# Patient Record
Sex: Male | Born: 2000
Health system: Southern US, Community
[De-identification: ages and names within clinical notes are randomized; demographics above are authoritative.]

---
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
## 2020-05-07 ENCOUNTER — Encounter (HOSPITAL_COMMUNITY): Payer: Self-pay

## 2020-05-07 ENCOUNTER — Other Ambulatory Visit: Payer: Self-pay

## 2020-05-07 ENCOUNTER — Ambulatory Visit (HOSPITAL_COMMUNITY)
Admission: EM | Admit: 2020-05-07 | Discharge: 2020-05-07 | Disposition: A | Discharge status: Home / Self Care | Payer: Medicaid Other

## 2020-05-07 DIAGNOSIS — R002 Palpitations: Secondary | ICD-10-CM

## 2020-05-07 NOTE — ED Triage Notes (Signed)
Pt presents with "feeling of fast heart beat" this morning for about 30 minutes.

## 2020-05-07 NOTE — Discharge Instructions (Signed)
Make sure to stay well hydrated, get plenty of rest and keep stress levels under control.  Follow up with your Primary Care or a Cardiologist if your symptoms do not improve.   [facility] Address: [address], [place], [place] [address] Hours:  Open ? Closes 5PM Phone: [phone]

## 2020-05-07 NOTE — ED Provider Notes (Signed)
[facility]    CSN: [id number] Arrival date & time: 05/07/20  1053      History   Chief Complaint Chief Complaint  Patient presents with  . Fast Heart Rate    HPI Lance Wilson is a 19 y.o. male.   Here today for evaluation of an episode this morning lasting about 30 minutes of intermittent palpitations, SOB. States he was walking around outside when this occurred. Did feel dehydrated at the time and notes that he hasn't slept in over 24 hours. Denies drug or alcohol use, caffeine consumption, dizziness, confusion, syncope, hx of heart issues, fhx of heart issues. Sxs self resolved earlier today.      History reviewed. No pertinent past medical history.  There are no problems to display for this patient.   History reviewed. No pertinent surgical history.     Home Medications    Prior to Admission medications   Medication Sig Start Date End Date Taking? Authorizing Provider  ibuprofen (ADVIL,MOTRIN) 600 MG tablet Take 1 tab PO Q6h x 2 days then Q6h prn 03/18/12   Lowanda Foster, NP    Family History Family History  Family history unknown: Yes    Social History Social History   Tobacco Use  . Smoking status: Never Smoker  Substance Use Topics  . Alcohol use: No  . Drug use: Not on file     Allergies   Patient has no known allergies.   Review of Systems Review of Systems PER HPI    Physical Exam Triage Vital Signs ED Triage Vitals  Enc Vitals Group     BP 05/07/20 1222 136/84     Pulse Rate 05/07/20 1218 73     Resp 05/07/20 1218 18     Temp 05/07/20 1218 98.1 F (36.7 C)     Temp Source 05/07/20 1218 Oral     SpO2 05/07/20 1218 97 %     Weight --      Height --      Head Circumference --      Peak Flow --      Pain Score 05/07/20 1221 0     Pain Loc --      Pain Edu? --      Excl. in GC? --    No data found.  Updated Vital Signs BP 136/84 (BP Location: Left Arm)   Pulse 73   Temp 98.1 F (36.7 C) (Oral)   Resp  18   SpO2 97%   Visual Acuity Right Eye Distance:   Left Eye Distance:   Bilateral Distance:    Right Eye Near:   Left Eye Near:    Bilateral Near:     Physical Exam Vitals and nursing note reviewed.  Constitutional:      Appearance: Normal appearance.  HENT:     Head: Atraumatic.  Eyes:     Extraocular Movements: Extraocular movements intact.     Conjunctiva/sclera: Conjunctivae normal.  Cardiovascular:     Rate and Rhythm: Normal rate and regular rhythm.     Pulses: Normal pulses.     Heart sounds: Normal heart sounds.  Pulmonary:     Effort: Pulmonary effort is normal.     Breath sounds: Normal breath sounds. No wheezing or rales.  Abdominal:     General: Bowel sounds are normal. There is no distension.     Palpations: Abdomen is soft.     Tenderness: There is no abdominal tenderness. There is no right CVA tenderness,  left CVA tenderness or guarding.  Musculoskeletal:        General: Normal range of motion.     Cervical back: Normal range of motion and neck supple.  Skin:    General: Skin is warm and dry.  Neurological:     General: No focal deficit present.     Mental Status: He is oriented to person, place, and time.  Psychiatric:        Mood and Affect: Mood normal.        Thought Content: Thought content normal.        Judgment: Judgment normal.      UC Treatments / Results  Labs (all labs ordered are listed, but only abnormal results are displayed) Labs Reviewed - No data to display  EKG   Radiology No results found.  Procedures Procedures (including critical care time)  Medications Ordered in UC Medications - No data to display  Initial Impression / Assessment and Plan / UC Course  I have reviewed the triage vital signs and the nursing notes.  Pertinent labs & imaging results that were available during my care of the patient were reviewed by me and considered in my medical decision making (see chart for details).     Sxs resolved, exam  and vitals benign, pt declining EKG and labs today because he needs to get to work. Discussed good hydration, good sleep habits, stress control, and gave Cardiology information if sxs recur/worsen.   Final Clinical Impressions(s) / UC Diagnoses   Final diagnoses:  Palpitations     Discharge Instructions     Make sure to stay well hydrated, get plenty of rest and keep stress levels under control.  Follow up with your Primary Care or a Cardiologist if your symptoms do not improve.   [facility] Address: [address], [place], [place] [address] Hours:  Open ? Closes 5PM Phone: [phone]    ED Prescriptions    None     PDMP not reviewed this encounter.   Particia Nearing, [place] 05/07/20 1334

## 2020-11-06 ENCOUNTER — Ambulatory Visit (HOSPITAL_COMMUNITY): Payer: Self-pay

## 2020-11-08 ENCOUNTER — Telehealth (HOSPITAL_COMMUNITY): Payer: Self-pay

## 2020-11-08 ENCOUNTER — Ambulatory Visit (HOSPITAL_COMMUNITY)
Admission: EM | Admit: 2020-11-08 | Discharge: 2020-11-08 | Disposition: A | Discharge status: Home / Self Care | Payer: Medicaid Other | Attending: Emergency Medicine | Admitting: Emergency Medicine

## 2020-11-08 ENCOUNTER — Encounter (HOSPITAL_COMMUNITY): Payer: Self-pay

## 2020-11-08 DIAGNOSIS — Z113 Encounter for screening for infections with a predominantly sexual mode of transmission: Secondary | ICD-10-CM | POA: Diagnosis not present

## 2020-11-08 DIAGNOSIS — R369 Urethral discharge, unspecified: Secondary | ICD-10-CM | POA: Diagnosis present

## 2020-11-08 LAB — HIV ANTIBODY (ROUTINE TESTING W REFLEX): HIV Screen 4th Generation wRfx: NONREACTIVE

## 2020-11-08 MED ORDER — CEFTRIAXONE SODIUM 500 MG IJ SOLR
500.0000 mg | Freq: Once | INTRAMUSCULAR | Status: AC
  Administered 2020-11-08: 500 mg via INTRAMUSCULAR

## 2020-11-08 MED ORDER — LIDOCAINE HCL (PF) 1 % IJ SOLN
INTRAMUSCULAR | Status: AC
  Filled 2020-11-08: qty 2

## 2020-11-08 MED ORDER — CEFTRIAXONE SODIUM 500 MG IJ SOLR
INTRAMUSCULAR | Status: AC
  Filled 2020-11-08: qty 500

## 2020-11-08 MED ORDER — DOXYCYCLINE HYCLATE 100 MG PO CAPS: 100.0000 mg | ORAL_CAPSULE | Freq: Two times a day (BID) | ORAL | 0 refills | Status: AC

## 2020-11-08 MED ORDER — DOXYCYCLINE HYCLATE 100 MG PO CAPS: 100.0000 mg | ORAL_CAPSULE | Freq: Two times a day (BID) | ORAL | 0 refills | Status: DC

## 2020-11-08 NOTE — ED Triage Notes (Signed)
Pt present penile discharge with a burning sensation after urinating. Symptom started 3 days ago.

## 2020-11-08 NOTE — ED Provider Notes (Signed)
HPI  SUBJECTIVE:  Lance Wilson is a 20 y.o. male who presents with 3 to 4 days of dysuria, penile discharge.  Unable to characterize the penile discharge.  He had intercourse with a new male partner recently, did not use condoms.  He does not know if she has any symptoms.  He has no other urinary complaints.  No genital rash, itching, vomiting abdominal, back, pelvic, perineal pain.  No testicular, scrotal pain, swelling.  No aggravating or alleviating factors.  He has not tried anything for symptoms.  Past medical history negative for gonorrhea, chlamydia, HIV, HSV, syphilis, trichomonas, UTI, diabetes.  PMD: None   History reviewed. No pertinent past medical history.  History reviewed. No pertinent surgical history.  Family History  Family history unknown: Yes    Social History   Tobacco Use  . Smoking status: Never Smoker  Substance Use Topics  . Alcohol use: No     Current Facility-Administered Medications:  .  cefTRIAXone (ROCEPHIN) injection 500 mg, 500 mg, Intramuscular, Once, Domenick Gong, MD  Current Outpatient Medications:  .  doxycycline (VIBRAMYCIN) 100 MG capsule, Take 1 capsule (100 mg total) by mouth 2 (two) times daily for 7 days., Disp: 14 capsule, Rfl: 0 .  ibuprofen (ADVIL,MOTRIN) 600 MG tablet, Take 1 tab PO Q6h x 2 days then Q6h prn, Disp: 30 tablet, Rfl: 0  No Known Allergies   ROS  As noted in HPI.   Physical Exam  BP (!) 162/88 (BP Location: Right Arm)   Pulse 71   Temp 98.9 F (37.2 C) (Oral)   Resp 18   SpO2 100%   Constitutional: Well developed, well nourished, no acute distress Eyes:  EOMI, conjunctiva normal bilaterally HENT: Normocephalic, atraumatic,mucus membranes moist Respiratory: Normal inspiratory effort Cardiovascular: Normal rate GI: nondistended GU: Normal uncircumcised male, testes descended bilaterally.  No penile rash, obvious discharge.  No testicular, epididymal, scrotal swelling, tenderness.  No  appreciable inguinal lymphadenopathy.  Patient declined chaperone. skin: No rash, skin intact Musculoskeletal: no deformities Neurologic: Alert & oriented x 3, no focal neuro deficits Psychiatric: Speech and behavior appropriate   ED Course   Medications  cefTRIAXone (ROCEPHIN) injection 500 mg (has no administration in time range)    Orders Placed This Encounter  Procedures  . HIV Antibody (routine testing w rflx)    Standing Status:   Standing    Number of Occurrences:   1  . RPR    Standing Status:   Standing    Number of Occurrences:   1    No results found for this or any previous visit (from the past 24 hour(s)). No results found.  ED Clinical Impression  1. Penile discharge   2. Screening examination for STD (sexually transmitted disease)      ED Assessment/Plan  will treat empirically for gonorrhea and chlamydia.  Swab for gonorrhea, chlamydia, trichomonas sent.  Will give 500 mg of Rocephin here, send home with doxycycline for a week.  Advised patient to refrain from intercourse for 2 weeks.  Will also check HIV and RPR.  Will provide primary care list for ongoing care, order assistance in finding PMD.  Discussed labs, MDM, treatment plan, and plan for follow-up with patient. patient agrees with plan.   Meds ordered this encounter  Medications  . cefTRIAXone (ROCEPHIN) injection 500 mg  . doxycycline (VIBRAMYCIN) 100 MG capsule    Sig: Take 1 capsule (100 mg total) by mouth 2 (two) times daily for 7 days.  Dispense:  14 capsule    Refill:  0    *This clinic note was created using Scientist, clinical (histocompatibility and immunogenetics). Therefore, there may be occasional mistakes despite careful proofreading.   ?    Domenick Gong, MD 11/08/20 1225

## 2020-11-08 NOTE — Discharge Instructions (Addendum)
Take the medication as written.  Finish the doxycycline, even if you feel better.  Give [place] a working phone number so that we can contact you if needed. Refrain from sexual contact for 2 weeks in order to give the medicine time to work.    Below is a list of primary care practices who are taking new patients for you to follow-up with.  [facility] internal medicine clinic Ground Floor - [facility], [address] [place], [place], [place] [address] [phone]  [facility] at [facility] [address] [place], [place] [address] [phone]  [facility] [address] [place], [place] [address] [phone]  [facility] [phone] [address] [place] [address]  [facility]: [address] [place] [address]  [phone]  [facility]: [address] [place] [address]   [phone]  [facility]: [address] [place] [address]  [phone]  [facility] : [address] [place] [address] [phone]  [facility] Primary Care: [address] [place] [address] [phone]  [facility]: [address] [place] [address] [phone]  Dr. Oneal Grout [address] [facility] [place] [address]  [phone]  Dr. Jackie Plum, [facility]. [address] [place], [place] [address]  [phone]  Go to www.goodrx.com to look up your medications. This will give you a list of where you can find your prescriptions at the most affordable prices. Or ask the pharmacist what the cash price is, or if they have any other discount programs available to help make your medication more affordable. This can be less  expensive than what you would pay with insurance.

## 2020-11-09 ENCOUNTER — Ambulatory Visit (HOSPITAL_COMMUNITY): Payer: Self-pay

## 2020-11-09 LAB — CYTOLOGY, (ORAL, ANAL, URETHRAL) ANCILLARY ONLY
Chlamydia: NEGATIVE
Comment: NEGATIVE
Comment: NEGATIVE
Comment: NORMAL
Neisseria Gonorrhea: NEGATIVE
Trichomonas: NEGATIVE

## 2020-11-09 LAB — RPR: RPR Ser Ql: NONREACTIVE

## 2020-12-15 ENCOUNTER — Telehealth: Payer: Medicaid Other | Admitting: Nurse Practitioner

## 2020-12-15 DIAGNOSIS — Z5329 Procedure and treatment not carried out because of patient's decision for other reasons: Secondary | ICD-10-CM

## 2020-12-15 DIAGNOSIS — Z91199 Patient's noncompliance with other medical treatment and regimen due to unspecified reason: Secondary | ICD-10-CM

## 2020-12-15 NOTE — Progress Notes (Signed)
No show

## 2021-04-10 ENCOUNTER — Encounter: Payer: Self-pay | Admitting: Family

## 2021-04-10 ENCOUNTER — Telehealth: Payer: Medicaid Other | Admitting: Family

## 2021-04-10 DIAGNOSIS — L0291 Cutaneous abscess, unspecified: Secondary | ICD-10-CM | POA: Diagnosis not present

## 2021-04-10 MED ORDER — SULFAMETHOXAZOLE-TRIMETHOPRIM 800-160 MG PO TABS: 1.0000 | ORAL_TABLET | Freq: Two times a day (BID) | ORAL | 0 refills | Status: DC

## 2021-04-10 NOTE — Progress Notes (Signed)
Virtual Visit Consent   Lance Wilson, you are scheduled for a virtual visit with a [facility] provider today.     Just as with appointments in the office, your consent must be obtained to participate.  Your consent will be active for this visit and any virtual visit you may have with one of our providers in the next 365 days.     If you have a MyChart account, a copy of this consent can be sent to you electronically.  All virtual visits are billed to your insurance company just like a traditional visit in the office.    As this is a virtual visit, video technology does not allow for your provider to perform a traditional examination.  This may limit your provider's ability to fully assess your condition.  If your provider identifies any concerns that need to be evaluated in person or the need to arrange testing (such as labs, EKG, etc.), we will make arrangements to do so.     Although advances in technology are sophisticated, we cannot ensure that it will always work on either your end or our end.  If the connection with a video visit is poor, the visit may have to be switched to a telephone visit.  With either a video or telephone visit, we are not always able to ensure that we have a secure connection.     I need to obtain your verbal consent now.   Are you willing to proceed with your visit today?    [name] has provided verbal consent on 04/10/2021 for a virtual visit (video or telephone).   [name], FNP   Date: 04/10/2021 4:01 PM   Virtual Visit via Video Note   I, [name], connected with  Lance Wilson  ([id number], 22-Feb-2001) on 04/10/21 at  4:00 PM EDT by a video-enabled telemedicine application and verified that I am speaking with the correct person using two identifiers.  Location: Patient: Virtual Visit Location Patient: Home Provider: Virtual Visit Location Provider: Home   I discussed the limitations of evaluation and management by  telemedicine and the availability of in person appointments. The patient expressed understanding and agreed to proceed.    History of Present Illness: Lance Wilson is a 20 y.o. who identifies as a male who was assigned male at birth, and is being seen today for abscess he noticed two days ago. He reports yellow thick, bloody discharge. Denies any fever. He reports his pain was a 5 out 10 , but improved since draining. The area is still draining a bloody discharge and is larger than a fifty cent piece.   HPI: HPI  Problems: There are no problems to display for this patient.   Allergies: No Known Allergies Medications:  Current Outpatient Medications:    sulfamethoxazole-trimethoprim (BACTRIM DS) 800-160 MG tablet, Take 1 tablet by mouth 2 (two) times daily., Disp: 14 tablet, Rfl: 0   ibuprofen (ADVIL,MOTRIN) 600 MG tablet, Take 1 tab PO Q6h x 2 days then Q6h prn, Disp: 30 tablet, Rfl: 0  Observations/Objective: Patient is well-developed, well-nourished in no acute distress.  Resting comfortably  at home.  Head is normocephalic, atraumatic.  No labored breathing. Speech is clear and coherent with logical content.  Patient is alert and oriented at baseline.  Pustule erythemas in groin,  Assessment and Plan: 1. Abscess - sulfamethoxazole-trimethoprim (BACTRIM DS) 800-160 MG tablet; Take 1 tablet by mouth 2 (two) times daily.  Dispense: 14 tablet; Refill: 0  Warm compresses  Avoid picking or squeezing Report any fever or worsening of symptoms   Follow Up Instructions: I discussed the assessment and treatment plan with the patient. The patient was provided an opportunity to ask questions and all were answered. The patient agreed with the plan and demonstrated an understanding of the instructions.  A copy of instructions were sent to the patient via MyChart.  The patient was advised to call back or seek an in-person evaluation if the symptoms worsen or if the condition fails to  improve as anticipated.  Time:  I spent 11 minutes with the patient via telehealth technology discussing the above problems/concerns.    [name], FNP

## 2022-07-30 ENCOUNTER — Telehealth: Payer: Medicaid Other | Admitting: Nurse Practitioner

## 2022-07-30 DIAGNOSIS — B9689 Other specified bacterial agents as the cause of diseases classified elsewhere: Secondary | ICD-10-CM | POA: Diagnosis not present

## 2022-07-30 DIAGNOSIS — J028 Acute pharyngitis due to other specified organisms: Secondary | ICD-10-CM | POA: Diagnosis not present

## 2022-07-30 MED ORDER — AMOXICILLIN 250 MG/5ML PO SUSR: 500.0000 mg | Freq: Three times a day (TID) | ORAL | 0 refills | Status: AC

## 2022-07-30 MED ORDER — IBUPROFEN 100 MG/5ML PO SUSP: 600.0000 mg | Freq: Four times a day (QID) | ORAL | 0 refills | Status: DC

## 2022-07-30 NOTE — Progress Notes (Signed)
Virtual Visit Consent   Lance Wilson, you are scheduled for a virtual visit with a [facility] provider today. Just as with appointments in the office, your consent must be obtained to participate. Your consent will be active for this visit and any virtual visit you may have with one of our providers in the next 365 days. If you have a MyChart account, a copy of this consent can be sent to you electronically.  As this is a virtual visit, video technology does not allow for your provider to perform a traditional examination. This may limit your provider's ability to fully assess your condition. If your provider identifies any concerns that need to be evaluated in person or the need to arrange testing (such as labs, EKG, etc.), we will make arrangements to do so. Although advances in technology are sophisticated, we cannot ensure that it will always work on either your end or our end. If the connection with a video visit is poor, the visit may have to be switched to a telephone visit. With either a video or telephone visit, we are not always able to ensure that we have a secure connection.  By engaging in this virtual visit, you consent to the provision of healthcare and authorize for your insurance to be billed (if applicable) for the services provided during this visit. Depending on your insurance coverage, you may receive a charge related to this service.  I need to obtain your verbal consent now. Are you willing to proceed with your visit today? [name] has provided verbal consent on 07/30/2022 for a virtual visit (video or telephone). Claiborne Rigg, NP  Date: 07/30/2022 4:44 PM  Virtual Visit via Video Note   I, Claiborne Rigg, connected with  HUE FRICK  ([id number], 05-15-2001) on 07/30/22 at  4:30 PM EST by a video-enabled telemedicine application and verified that I am speaking with the correct person using two identifiers.  Location: Patient: Virtual Visit  Location Patient: Mobile Provider: Virtual Visit Location Provider: Home Office   I discussed the limitations of evaluation and management by telemedicine and the availability of in person appointments. The patient expressed understanding and agreed to proceed.    History of Present Illness: Lance Wilson is a 21 y.o. who identifies as a male who was assigned male at birth, and is being seen today for bacterial pharyngitis.    Patient complains of sore throat. Associated symptoms include chills, enlarged tonsils, headache, pain while swallowing, sore throat, swollen glands, and white spots in throat.Onset of symptoms was several days ago, gradually worsening since that time. He is not drinking much. He has not had recent close exposure to someone with proven streptococcal pharyngitis. Sore throat, fever, chills, Onset of symptoms Thursday. Hurts to swallow.    Problems: There are no problems to display for this patient.   Allergies: No Known Allergies Medications:  Current Outpatient Medications:    amoxicillin (AMOXIL) 250 MG/5ML suspension, Take 10 mLs (500 mg total) by mouth 3 (three) times daily for 10 days., Disp: 300 mL, Rfl: 0   ibuprofen (ADVIL) 100 MG/5ML suspension, Take 30 mLs (600 mg total) by mouth every 6 (six) hours., Disp: 480 mL, Rfl: 0   sulfamethoxazole-trimethoprim (BACTRIM DS) 800-160 MG tablet, Take 1 tablet by mouth 2 (two) times daily., Disp: 14 tablet, Rfl: 0  Observations/Objective: Patient is well-developed, well-nourished in no acute distress.  Resting comfortably at home.  Head is normocephalic, atraumatic.  No labored breathing.  Speech is clear but slightly garbled due to pain in throat. Logical content.  Patient is alert and oriented at baseline.  3+ tonsils with tonsil stones  Assessment and Plan: 1. Bacterial pharyngitis - ibuprofen (ADVIL) 100 MG/5ML suspension; Take 30 mLs (600 mg total) by mouth every 6 (six) hours.  Dispense: 480 mL;  Refill: 0 Instructed to be seen in the ED if symptoms worsen, unable to swallow secretions or difficulty breathing  Follow Up Instructions: I discussed the assessment and treatment plan with the patient. The patient was provided an opportunity to ask questions and all were answered. The patient agreed with the plan and demonstrated an understanding of the instructions.  A copy of instructions were sent to the patient via MyChart unless otherwise noted below.    The patient was advised to call back or seek an in-person evaluation if the symptoms worsen or if the condition fails to improve as anticipated.  Time:  I spent 12 minutes with the patient via telehealth technology discussing the above problems/concerns.    Claiborne Rigg, NP

## 2022-07-30 NOTE — Patient Instructions (Signed)
  Helen Hashimoto, thank you for joining Claiborne Rigg, NP for today's virtual visit.  While this provider is not your primary care provider (PCP), if your PCP is located in our provider database this encounter information will be shared with them immediately following your visit.   A [facility] MyChart account gives you access to today's visit and all your visits, tests, and labs performed at [facility] " click here if you don't have a [facility] MyChart account or go to [email or web address]  Consent: (Patient) Lance Wilson provided verbal consent for this virtual visit at the beginning of the encounter.  Current Medications:  Current Outpatient Medications:    amoxicillin (AMOXIL) 250 MG/5ML suspension, Take 10 mLs (500 mg total) by mouth 3 (three) times daily for 10 days., Disp: 300 mL, Rfl: 0   ibuprofen (ADVIL) 100 MG/5ML suspension, Take 30 mLs (600 mg total) by mouth every 6 (six) hours., Disp: 480 mL, Rfl: 0   sulfamethoxazole-trimethoprim (BACTRIM DS) 800-160 MG tablet, Take 1 tablet by mouth 2 (two) times daily., Disp: 14 tablet, Rfl: 0   Medications ordered in this encounter:  Meds ordered this encounter  Medications   ibuprofen (ADVIL) 100 MG/5ML suspension    Sig: Take 30 mLs (600 mg total) by mouth every 6 (six) hours.    Dispense:  480 mL    Refill:  0    Order Specific Question:   Supervising Provider    Answer:   Merrilee Jansky [[id number]]   amoxicillin (AMOXIL) 250 MG/5ML suspension    Sig: Take 10 mLs (500 mg total) by mouth 3 (three) times daily for 10 days.    Dispense:  300 mL    Refill:  0    Order Specific Question:   Supervising Provider    Answer:   Merrilee Jansky [[id number]]     *If you need refills on other medications prior to your next appointment, please contact your pharmacy*  Follow-Up: Call back or seek an in-person evaluation if the symptoms worsen or if the condition fails to improve as anticipated.  [facility] [phone]  Other Instructions Instructed to be seen in the ED if symptoms worsen, unable to swallow secretions or difficulty breathing   If you have been instructed to have an in-person evaluation today at a local Urgent Care facility, please use the link below. It will take you to a list of all of our available [facility], including address, phone number and hours of operation. Please do not delay care.  [facility]  If you or a family member do not have a primary care provider, use the link below to schedule a visit and establish care. When you choose a [facility] primary care physician or advanced practice provider, you gain a long-term partner in health. Find a Primary Care Provider  Learn more about [facility] in-office and virtual care options:  - Get Care Now

## 2023-05-09 ENCOUNTER — Telehealth: Payer: Medicaid Other | Admitting: Family Medicine

## 2023-05-09 DIAGNOSIS — N50812 Left testicular pain: Secondary | ICD-10-CM

## 2023-05-09 NOTE — Progress Notes (Signed)
Because you need in person evaluation of your symptoms, your condition warrants further evaluation and I recommend that you be seen in a face to face visit.   NOTE: There will be NO CHARGE for this eVisit   If you are having a true medical emergency please call 911.

## 2023-05-10 ENCOUNTER — Ambulatory Visit (HOSPITAL_COMMUNITY)
Admission: RE | Admit: 2023-05-10 | Discharge: 2023-05-10 | Disposition: A | Discharge status: Home / Self Care | Payer: Medicaid Other | Source: Ambulatory Visit | Attending: Sports Medicine | Admitting: Sports Medicine

## 2023-05-10 ENCOUNTER — Encounter (HOSPITAL_COMMUNITY): Payer: Self-pay

## 2023-05-10 VITALS — BP 158/97 | HR 82 | Temp 98.7°F | Resp 16

## 2023-05-10 DIAGNOSIS — M545 Low back pain, unspecified: Secondary | ICD-10-CM | POA: Diagnosis present

## 2023-05-10 DIAGNOSIS — N50811 Right testicular pain: Secondary | ICD-10-CM

## 2023-05-10 LAB — POCT URINALYSIS DIP (MANUAL ENTRY)
Bilirubin, UA: NEGATIVE
Blood, UA: NEGATIVE
Glucose, UA: NEGATIVE mg/dL
Ketones, POC UA: NEGATIVE mg/dL
Leukocytes, UA: NEGATIVE
Nitrite, UA: NEGATIVE
Protein Ur, POC: 100 mg/dL — AB
Spec Grav, UA: >=1.03 — AB (ref 1.010–1.025)
Urobilinogen, UA: 0.2 U/dL
pH, UA: 6 (ref 5.0–8.0)

## 2023-05-10 NOTE — ED Provider Notes (Signed)
[facility]    CSN: [id number] Arrival date & time: 05/10/23  0931      History   Chief Complaint Chief Complaint  Patient presents with   Back Pain    Pain in my right testicular. Not serve though - Entered by patient    HPI Lance Wilson is a 22 y.o. male.   He notes right-sided low back pain ongoing for the past 4 days as well as some right sided testicular pain.  He denies any known injury to his low back.  He cannot reproduce the pain with palpation though states that when he is side bending or flexing his abdomen it reproduces the pain.  He denies any radiating pain or numbness down his right leg.  He denies incontinence or lower extremity weakness.  He denies any pain or burning with urination or urethral discharge.  No hematuria.  No abdominal pain, fevers, or N/V. He is sexually active with 1 partner and does not have much concern from an STI standpoint.  Does have a history of STIs.  The history is provided by the patient.  Back Pain   History reviewed. No pertinent past medical history.  There are no problems to display for this patient.   History reviewed. No pertinent surgical history.     Home Medications    Prior to Admission medications   Medication Sig Start Date End Date Taking? Authorizing Provider  ibuprofen (ADVIL) 100 MG/5ML suspension Take 30 mLs (600 mg total) by mouth every 6 (six) hours. 07/30/22   Claiborne Rigg, NP  sulfamethoxazole-trimethoprim (BACTRIM DS) 800-160 MG tablet Take 1 tablet by mouth 2 (two) times daily. 04/10/21   Junie Spencer, FNP    Family History Family History  Family history unknown: Yes    Social History Social History   Tobacco Use   Smoking status: Never  Substance Use Topics   Alcohol use: No     Allergies   Patient has no known allergies.   Review of Systems Review of Systems  Musculoskeletal:  Positive for back pain.   ROS otherwise negative except as documented in  HPI  Physical Exam  No data found.  Updated Vital Signs BP (!) 158/97 (BP Location: Left Arm)   Pulse 82   Temp 98.7 F (37.1 C) (Oral)   Resp 16   SpO2 98%   Physical Exam Constitutional:      General: He is not in acute distress.    Appearance: Normal appearance. He is obese.  HENT:     Head: Normocephalic and atraumatic.  Cardiovascular:     Rate and Rhythm: Normal rate and regular rhythm.  Pulmonary:     Effort: Pulmonary effort is normal. No respiratory distress.  Abdominal:     Palpations: Abdomen is soft.     Tenderness: There is no abdominal tenderness. There is no right CVA tenderness, left CVA tenderness or guarding.  Genitourinary:    Penis: Normal.      Testes: Normal.     Comments: Bilateral testes palpable and nonreproducible tenderness.  No swelling appreciated in the scrotum.  Normal cremasteric reflex. Musculoskeletal:     Comments: Low back without visible deformity or scoliosis. No reproducible tenderness palpation along the midline of his spine or paraspinal muscles.  No SI joint tenderness to palpation.  No gluteus tenderness to palpation.  No oblique tenderness to palpation.  Negative Stinchfield.  5 out of 5 strength on hip flexion and hip abduction.  Negative  straight leg raise and seated slump test. NVI distally.  Neurological:     Mental Status: He is alert.      UC Treatments / Results  Labs (all labs ordered are listed, but only abnormal results are displayed) Labs Reviewed  POCT URINALYSIS DIP (MANUAL ENTRY) - Abnormal; Notable for the following components:      Result Value   Spec Grav, UA >=1.030 (*)    Protein Ur, POC =100 (*)    All other components within normal limits  CYTOLOGY, (ORAL, ANAL, URETHRAL) ANCILLARY ONLY    EKG   Radiology No results found.  Procedures Procedures (including critical care time)  Medications Ordered in UC Medications - No data to display  Initial Impression / Assessment and Plan / UC Course   I have reviewed the triage vital signs and the nursing notes.  Pertinent labs & imaging results that were available during my care of the patient were reviewed by me and considered in my medical decision making (see chart for details).    History and physical exam without any red flags from a low back pain standpoint.  I think this is muscular in nature and provided him home exercise program for lumbar stretching and strengthening.  UA collected without any evidence of infection to suggest [place] though does appear to be slightly dehydrated and he does have a component of proteinuria.  Normal genitourinary exam - low concern for urgent issues such as testicular torsion.  He does have risk factors for STIs so we will check for gonorrhea and chlamydia and communicate these results if they are positive.  On review of his vital signs over the past few years it does appear that he has underlying hypertension and I recommended that he seek evaluation in continuity of care with his primary care physician for long-term management of this.  His questions were answered he is in agreement with this plan.   Final Clinical Impressions(s) / UC Diagnoses   Final diagnoses:  Acute right-sided low back pain without sciatica  Pain in right testicle     Discharge Instructions      Your low back pain is muscular in nature.  I would like for you to apply heat to this as needed and work on a low back stretching and strengthening program (see handout provided during today's visit). For your testicle pain - we have collected STI testing. These results take a day or two to come back. If you don't hear from [place] these were negative.  I recommend you see your primary care doctor in the next month or so about your blood pressure. I recommend you purchase a blood pressure cuff online or at any pharmacy to keep a log of your pressures prior to that visit.     ED Prescriptions   None    PDMP not reviewed this  encounter.   Marisa Cyphers, MD 05/10/23 [phone]

## 2023-05-10 NOTE — Discharge Instructions (Addendum)
Your low back pain is muscular in nature.  I would like for you to apply heat to this as needed and work on a low back stretching and strengthening program (see handout provided during today's visit). For your testicle pain - we have collected STI testing. These results take a day or two to come back. If you don't hear from [place] these were negative.  I recommend you see your primary care doctor in the next month or so about your blood pressure. I recommend you purchase a blood pressure cuff online or at any pharmacy to keep a log of your pressures prior to that visit.

## 2023-05-10 NOTE — ED Triage Notes (Signed)
Pt presents to the office for right side flank and back pain. Pt reports his urine has a odor.

## 2023-05-11 LAB — CYTOLOGY, (ORAL, ANAL, URETHRAL) ANCILLARY ONLY
Chlamydia: NEGATIVE
Comment: NEGATIVE
Comment: NEGATIVE
Comment: NORMAL
Neisseria Gonorrhea: NEGATIVE
Trichomonas: NEGATIVE

## 2024-01-09 ENCOUNTER — Telehealth: Admitting: Physician Assistant

## 2024-01-09 DIAGNOSIS — Z202 Contact with and (suspected) exposure to infections with a predominantly sexual mode of transmission: Secondary | ICD-10-CM | POA: Diagnosis not present

## 2024-01-09 MED ORDER — METRONIDAZOLE 500 MG PO TABS: ORAL_TABLET | ORAL | 0 refills | Status: DC

## 2024-01-09 NOTE — Patient Instructions (Signed)
 Kate Pak, thank you for joining Hyla Maillard, PA-C for today's virtual visit.  While this provider is not your primary care provider (PCP), if your PCP is located in our provider database this encounter information will be shared with them immediately following your visit.   A [facility] MyChart account gives you access to today's visit and all your visits, tests, and labs performed at [facility] " click here if you don't have a [facility] MyChart account or go to [email or web address]  Consent: (Patient) Lance Wilson provided verbal consent for this virtual visit at the beginning of the encounter.  Current Medications:  Current Outpatient Medications:    ibuprofen  (ADVIL ) 100 MG/5ML suspension, Take 30 mLs (600 mg total) by mouth every 6 (six) hours., Disp: 480 mL, Rfl: 0   sulfamethoxazole -trimethoprim  (BACTRIM  DS) 800-160 MG tablet, Take 1 tablet by mouth 2 (two) times daily., Disp: 14 tablet, Rfl: 0   Medications ordered in this encounter:  No orders of the defined types were placed in this encounter.    *If you need refills on other medications prior to your next appointment, please contact your pharmacy*  Follow-Up: Call back or seek an in-person evaluation if the symptoms worsen or if the condition fails to improve as anticipated.  [facility] [phone]  Other Instructions Trichomoniasis Trichomoniasis is a sexually transmitted infection (STI). Many people with trichomoniasis do not have any symptoms (are asymptomatic) or have only minimal symptoms. Untreated trichomoniasis can last from months to years. This condition is treated with medicine. What are the causes? This condition is caused by a parasite called Trichomonas vaginalis and is transmitted during sexual contact. What increases the risk? The following factors may make you more likely to develop this condition: Having unprotected sex. Having sex with a  partner who has trichomoniasis. Having multiple sexual partners. Having had previous trichomoniasis infections or other STIs. What are the signs or symptoms? In females, symptoms of trichomoniasis include: Itching, burning, redness, or soreness in the genital area. Discomfort while urinating. Abnormal vaginal discharge that is clear, white, gray, or yellow-green and foamy and has an unusual fishy odor. In males, symptoms of trichomoniasis include: Discharge from the penis. Burning after urination or ejaculation. Itching or discomfort inside the penis. How is this diagnosed? This condition is diagnosed based on tests. To perform a test, your health care provider will do one of the following: Ask you to provide a urine sample. Take a sample of discharge. The sample may be taken from the vagina or cervix in females and from the urethra in males. Your health care provider may use a swab to collect the sample. Your health care provider may test you for other STIs, including human immunodeficiency virus (HIV). How is this treated?  This condition is treated with medicines such as metronidazole or tinidazole. These are called antimicrobial medicines, and they are taken by mouth (orally). Your sexual partner or partners also need to be tested and treated. If they have the infection and are not treated, you will likely get reinfected. If you plan to become pregnant or think you may be pregnant, tell your health care provider right away. Some medicines that are used to treat the infection should not be taken during pregnancy. Your health care provider may recommend over-the-counter medicines or creams to help relieve itching or irritation. You may be tested for the infection again 3 months after treatment. Follow these instructions at home: Medicines Take over-the-counter and prescription medicines  only as told by your health care provider. Take your antimicrobial medicine as told by your health care  provider. Do not stop taking it even if you start to feel better. Use creams as told by your health care provider. General instructions Do not have sex until after you finish your medicine and your symptoms have resolved. Do not wear tampons while you have the infection (if you are male). Talk with your sexual partner or partners about any symptoms that either of you may have, as well as any history of STIs. Keep all follow-up visits. This is important. How is this prevented?  Use condoms every time you have sex. Using condoms correctly and consistently can help protect against STIs. Do not have sexual contact if you have symptoms of trichomoniasis or another STI. Avoid having multiple sexual partners. Get tested for STIs before you have sex with a partner. Ask all partners to do the same. Do not douche (if you are male). Douching may increase your risk for getting STIs due to the removal of good bacteria in the vagina. Contact a health care provider if: You still have symptoms after you finish your medicine. You develop a rash. You plan to become pregnant or think you may be pregnant. Summary Trichomoniasis is a sexually transmitted infection (STI). This condition often has no symptoms or minimal symptoms. Take your antimicrobial medicine as told by your health care provider. Do not stop taking even if you start to feel better. Discuss your infection with your sexual partner or partners. Make sure that all partners get tested and treated, if necessary. You should not have sex until after you finish your medicine and your symptoms have resolved. Keep all follow-up visits. This is important. This information is not intended to replace advice given to you by your health care provider. Make sure you discuss any questions you have with your health care provider. Document Revised: 07/07/2021 Document Reviewed: 07/07/2021 Elsevier Patient Education  2024 Elsevier Inc.   If you have been  instructed to have an in-person evaluation today at a local Urgent Care facility, please use the link below. It will take you to a list of all of our available [facility], including address, phone number and hours of operation. Please do not delay care.  [facility]  If you or a family member do not have a primary care provider, use the link below to schedule a visit and establish care. When you choose a [facility] primary care physician or advanced practice provider, you gain a long-term partner in health. Find a Primary Care Provider  Learn more about [facility] in-office and virtual care options: [facility] - Get Care Now

## 2024-01-09 NOTE — Progress Notes (Signed)
 Virtual Visit Consent   Lance Wilson, you are scheduled for a virtual visit with a [facility] provider today. Just as with appointments in the office, your consent must be obtained to participate. Your consent will be active for this visit and any virtual visit you may have with one of our providers in the next 365 days. If you have a MyChart account, a copy of this consent can be sent to you electronically.  As this is a virtual visit, video technology does not allow for your provider to perform a traditional examination. This may limit your provider's ability to fully assess your condition. If your provider identifies any concerns that need to be evaluated in person or the need to arrange testing (such as labs, EKG, etc.), we will make arrangements to do so. Although advances in technology are sophisticated, we cannot ensure that it will always work on either your end or our end. If the connection with a video visit is poor, the visit may have to be switched to a telephone visit. With either a video or telephone visit, we are not always able to ensure that we have a secure connection.  By engaging in this virtual visit, you consent to the provision of healthcare and authorize for your insurance to be billed (if applicable) for the services provided during this visit. Depending on your insurance coverage, you may receive a charge related to this service.  I need to obtain your verbal consent now. Are you willing to proceed with your visit today? Lance Wilson has provided verbal consent on 01/09/2024 for a virtual visit (video or telephone). Lance Wilson, [place]  Date: 01/09/2024 4:22 PM   Virtual Visit via Video Note   I, Lance Wilson, connected with  Lance Wilson  ([id number], 03/13/2001) on 01/09/24 at  4:15 PM EDT by a video-enabled telemedicine application and verified that I am speaking with the correct person using two identifiers.  Location: Patient:  Virtual Visit Location Patient: Home Provider: Virtual Visit Location Provider: Home Office   I discussed the limitations of evaluation and management by telemedicine and the availability of in person appointments. The patient expressed understanding and agreed to proceed.    History of Present Illness: Lance Wilson is a 23 y.o. who identifies as a male who was assigned male at birth, and is being seen today for STD exposure.  Endorses GF recently was tested for STI and tested positive for trichomoniasis. She was not having any symptoms, just having a routine STI check with GYN. Notes she was started on treatment and provider recommended her partner be treated as well.  Patient denies any symptoms at present. Was unable to go in for an in-person visit for the next several days due to work so was reaching out to the digital health team.    HPI: HPI  Problems: There are no active problems to display for this patient.   Allergies: No Known Allergies Medications:  Current Outpatient Medications:    metroNIDAZOLE (FLAGYL) 500 MG tablet, Take 4 tablets by mouth as a single dose., Disp: 4 tablet, Rfl: 0  Observations/Objective: Patient is well-developed, well-nourished in no acute distress.  Resting comfortably at home.  Head is normocephalic, atraumatic.  No labored breathing. Speech is clear and coherent with logical content.  Patient is alert and oriented at baseline.   Assessment and Plan: 1. STD exposure (Primary) - metroNIDAZOLE (FLAGYL) 500 MG tablet; Take 4 tablets by mouth as a single dose.  Dispense: 4 tablet; Refill: 0  Trich exposure. Asymptomatic. Unable to get into office for a few days. Will give empiric treatment with Metronidazole. He is to refrain from any activity until treatment complete and he has follow-up full STI testing.   Follow Up Instructions: I discussed the assessment and treatment plan with the patient. The patient was provided an opportunity to ask  questions and all were answered. The patient agreed with the plan and demonstrated an understanding of the instructions.  A copy of instructions were sent to the patient via MyChart unless otherwise noted below.   The patient was advised to call back or seek an in-person evaluation if the symptoms worsen or if the condition fails to improve as anticipated.    Lance Maillard, PA-C

## 2024-05-12 ENCOUNTER — Encounter (HOSPITAL_COMMUNITY): Payer: Self-pay

## 2024-05-12 ENCOUNTER — Ambulatory Visit (HOSPITAL_COMMUNITY)
Admission: EM | Admit: 2024-05-12 | Discharge: 2024-05-12 | Disposition: A | Discharge status: Home / Self Care | Attending: Emergency Medicine | Admitting: Emergency Medicine

## 2024-05-12 DIAGNOSIS — S61211A Laceration without foreign body of left index finger without damage to nail, initial encounter: Secondary | ICD-10-CM

## 2024-05-12 DIAGNOSIS — Z23 Encounter for immunization: Secondary | ICD-10-CM | POA: Diagnosis not present

## 2024-05-12 MED ORDER — MUPIROCIN 2 % EX OINT: 1.0000 | TOPICAL_OINTMENT | Freq: Two times a day (BID) | CUTANEOUS | 0 refills | Status: AC

## 2024-05-12 MED ORDER — TETANUS-DIPHTH-ACELL PERTUSSIS 5-2.5-18.5 LF-MCG/0.5 IM SUSY
PREFILLED_SYRINGE | INTRAMUSCULAR | Status: AC
  Filled 2024-05-12: qty 0.5

## 2024-05-12 MED ORDER — TETANUS-DIPHTH-ACELL PERTUSSIS 5-2.5-18.5 LF-MCG/0.5 IM SUSY
0.5000 mL | PREFILLED_SYRINGE | Freq: Once | INTRAMUSCULAR | Status: AC
  Administered 2024-05-12: 0.5 mL via INTRAMUSCULAR

## 2024-05-12 NOTE — Discharge Instructions (Signed)
 Apply mupirocin  ointment twice daily to the area for infection prevention. Keep the area clean dry and covered for infection prevention. If you notice increased swelling, spreading of redness, or puslike drainage from the area return here for reevaluation.

## 2024-05-12 NOTE — ED Triage Notes (Addendum)
 Patient states he was cutting up yams and cut his left pointer finger today. Patient does not remember when he had a Tetus  vaccine.  Patient denies taking any medications for his pain.

## 2024-05-12 NOTE — ED Provider Notes (Signed)
 [facility]    CSN: [id number] Arrival date & time: 05/12/24  1340      History   Chief Complaint Chief Complaint  Patient presents with   FINGER LACERATION    HPI Lance Wilson is a 23 y.o. male.   Patient presents with laceration to his left index finger that occurred today while cutting yams.  Patient is unsure of when he received his last tetanus booster.  Patient denies any other injuries from this incident.  The history is provided by the patient and medical records.    History reviewed. No pertinent past medical history.  There are no active problems to display for this patient.   History reviewed. No pertinent surgical history.     Home Medications    Prior to Admission medications   Medication Sig Start Date End Date Taking? Authorizing Provider  mupirocin  ointment (BACTROBAN ) 2 % Apply 1 Application topically 2 (two) times daily. 05/12/24  Yes Johnie Rumaldo LABOR, NP    Family History Family History  Problem Relation Age of Onset   Healthy Mother    Healthy Father     Social History Social History   Tobacco Use   Smoking status: Never  Vaping Use   Vaping status: Never Used  Substance Use Topics   Alcohol use: No   Drug use: Never     Allergies   Patient has no known allergies.   Review of Systems Review of Systems  Per HPI  Physical Exam Triage Vital Signs ED Triage Vitals  Encounter Vitals Group     BP 05/12/24 1429 (!) 155/92     Girls Systolic BP Percentile --      Girls Diastolic BP Percentile --      Boys Systolic BP Percentile --      Boys Diastolic BP Percentile --      Pulse Rate 05/12/24 1429 68     Resp 05/12/24 1429 16     Temp 05/12/24 1429 98.6 F (37 C)     Temp Source 05/12/24 1429 Oral     SpO2 05/12/24 1429 98 %     Weight --      Height --      Head Circumference --      Peak Flow --      Pain Score 05/12/24 1428 4     Pain Loc --      Pain Education --      Exclude from Growth  Chart --    No data found.  Updated Vital Signs BP (!) 155/92 (BP Location: Right Arm)   Pulse 68   Temp 98.6 F (37 C) (Oral)   Resp 16   SpO2 98%   Visual Acuity Right Eye Distance:   Left Eye Distance:   Bilateral Distance:    Right Eye Near:   Left Eye Near:    Bilateral Near:     Physical Exam Vitals and nursing note reviewed.  Constitutional:      General: He is awake. He is not in acute distress.    Appearance: Normal appearance. He is well-developed and well-groomed. He is not ill-appearing.  Skin:    General: Skin is warm and dry.     Findings: Laceration present.     Comments: Superficial avulsion laceration noted to lateral left index finger measuring approximately 2 cm x 1 cm.  Bleeding controlled at this time with pressure dressing.  Without nail involvement or presence of foreign body.  Neurological:  Mental Status: He is alert.  Psychiatric:        Behavior: Behavior is cooperative.      UC Treatments / Results  Labs (all labs ordered are listed, but only abnormal results are displayed) Labs Reviewed - No data to display  EKG   Radiology No results found.  Procedures Procedures (including critical care time)  Medications Ordered in UC Medications  Tdap (BOOSTRIX ) injection 0.5 mL (has no administration in time range)    Initial Impression / Assessment and Plan / UC Course  I have reviewed the triage vital signs and the nursing notes.  Pertinent labs & imaging results that were available during my care of the patient were reviewed by me and considered in my medical decision making (see chart for details).     Patient is overall well-appearing.  Vitals are stable.  Unable to repair due to left skin flap with avulsion laceration.  Cleaned wound in clinic and applied bacitracin and bulky dressing.  Updated Tdap in clinic.  Prescribed mupirocin  for infection prevention.  Discussed proper wound care at home.  Discussed follow-up and return  precautions. Final Clinical Impressions(s) / UC Diagnoses   Final diagnoses:  Laceration of left index finger without foreign body without damage to nail, initial encounter     Discharge Instructions      Apply mupirocin  ointment twice daily to the area for infection prevention. Keep the area clean dry and covered for infection prevention. If you notice increased swelling, spreading of redness, or puslike drainage from the area return here for reevaluation.   ED Prescriptions     Medication Sig Dispense Auth. Provider   mupirocin  ointment (BACTROBAN ) 2 % Apply 1 Application topically 2 (two) times daily. 22 g Johnie Flaming A, NP      PDMP not reviewed this encounter.   Johnie Flaming A, NP 05/12/24 [phone]

## 2024-05-12 NOTE — ED Notes (Signed)
Pressure dressing applied to laceration.
